# Patient Record
Sex: Male | Born: 1995 | Race: Black or African American | Hispanic: No | Marital: Single | Smoking: Former smoker
Health system: Southern US, Community
[De-identification: ages and names within clinical notes are randomized; demographics above are authoritative.]

---

## 2018-12-24 ENCOUNTER — Other Ambulatory Visit: Payer: Self-pay

## 2018-12-24 ENCOUNTER — Emergency Department: Payer: Self-pay

## 2018-12-24 ENCOUNTER — Emergency Department
Admission: EM | Admit: 2018-12-24 | Discharge: 2018-12-24 | Disposition: A | Discharge status: Home / Self Care | Payer: Self-pay | Attending: Emergency Medicine | Admitting: Emergency Medicine

## 2018-12-24 DIAGNOSIS — M5441 Lumbago with sciatica, right side: Secondary | ICD-10-CM | POA: Insufficient documentation

## 2018-12-24 MED ORDER — KETOROLAC TROMETHAMINE 30 MG/ML IJ SOLN
30.0000 mg | Freq: Once | INTRAMUSCULAR | Status: AC
  Administered 2018-12-24: 15:00:00 30 mg via INTRAMUSCULAR
  Filled 2018-12-24: qty 1

## 2018-12-24 MED ORDER — NAPROXEN 500 MG PO TABS: 500.0000 mg | ORAL_TABLET | Freq: Two times a day (BID) | ORAL | 0 refills | Status: AC

## 2018-12-24 NOTE — Discharge Instructions (Signed)
Follow-up with your primary care provider or Dr. Harlow Mares who is on-call for orthopedics if any continued problems with your back.  Take naproxen 500 mg twice daily with food.  You may use ice or heat to your back as needed for discomfort.

## 2018-12-24 NOTE — ED Provider Notes (Signed)
[facility] Emergency Department Provider Note   ____________________________________________   First MD Initiated Contact with Patient 12/24/18 1336     (approximate)  I have reviewed the triage vital signs and the nursing notes.   HISTORY  Chief Complaint Back Pain   HPI Eddie Mcdonald is a 23 y.o. male presents to the ED with complaint of low back pain shooting down his right leg.  Patient states that yesterday he was walking and stepped into a pothole.  He states that at that time he felt something "pop".  And since that time has experienced low back pain with radiation.  He has not taken any over-the-counter medication.  He denies any previous problems with his back.  He denies any urinary symptoms or incontinence of bowel or bladder.  Patient continues to ambulate without any assistance.  He rates his pain as a 7 out of 10.       History reviewed. No pertinent past medical history.  There are no active problems to display for this patient.   History reviewed. No pertinent surgical history.  Prior to Admission medications   Medication Sig Start Date End Date Taking? Authorizing Provider  naproxen (NAPROSYN) 500 MG tablet Take 1 tablet (500 mg total) by mouth 2 (two) times daily with a meal. 12/24/18   Tommi RumpsSummers, Issai Werling L, PA-C    Allergies Patient has no allergy information on record.  No family history on file.  Social History Social History   Tobacco Use   Smoking status: Not on file  Substance Use Topics   Alcohol use: Not on file   Drug use: Not on file    Review of Systems Constitutional: No fever/chills Cardiovascular: Denies chest pain. Respiratory: Denies shortness of breath. Gastrointestinal: No abdominal pain.  No nausea, no vomiting.  Genitourinary: Negative for dysuria. Musculoskeletal: Positive for low back pain.  Positive for right leg radiculopathy. Skin: Negative for rash. Neurological: Negative for headaches,  focal weakness or numbness. ___________________________________________   PHYSICAL EXAM:  VITAL SIGNS: ED Triage Vitals  Enc Vitals Group     BP 12/24/18 1319 119/81     Pulse Rate 12/24/18 1319 79     Resp 12/24/18 1319 18     Temp 12/24/18 1319 98.5 F (36.9 C)     Temp src --      SpO2 12/24/18 1319 99 %     Weight 12/24/18 1320 170 lb (77.1 kg)     Height 12/24/18 1320 6' (1.829 m)     Head Circumference --      Peak Flow --      Pain Score 12/24/18 1319 7     Pain Loc --      Pain Edu? --      Excl. in GC? --    Constitutional: Alert and oriented. Well appearing and in no acute distress. Eyes: Conjunctivae are normal.  Head: Atraumatic. Neck: No stridor.   Cardiovascular: Normal rate, regular rhythm. Grossly normal heart sounds.  Good peripheral circulation. Respiratory: Normal respiratory effort.  No retractions. Lungs CTAB. Musculoskeletal: On examination of the back there is no gross deformity and no step-offs noted on palpation of thoracic or lumbar spine.  There is tenderness on palpation of the right paravertebral muscles adjacent to L5-S1 area and the right SI joint region.  Good muscle strength bilaterally. Neurologic:  Normal speech and language. No gross focal neurologic deficits are appreciated. No gait instability. Skin:  Skin is warm, dry and intact. No rash  noted. Psychiatric: Mood and affect are normal. Speech and behavior are normal.  ____________________________________________   LABS (all labs ordered are listed, but only abnormal results are displayed)  Labs Reviewed - No data to display  RADIOLOGY   Official radiology report(s): Dg Lumbar Spine 2-3 Views  Result Date: 12/24/2018 CLINICAL DATA:  Low back pain radiating to the right lower extremity. EXAM: LUMBAR SPINE - 2-3 VIEW COMPARISON:  None. FINDINGS: There is no acute fracture or dislocation. The alignment is normal. There is probable minimal decreased intervertebral space at L4-5 and  L5-S1. IMPRESSION: Probable minimal decreased intervertebral space in the lower lumbar spine. The exam is otherwise normal. Electronically Signed   By: Abelardo Diesel M.D.   On: 12/24/2018 14:36    ____________________________________________   PROCEDURES  Procedure(s) performed (including Critical Care):  Procedures  ____________________________________________   INITIAL IMPRESSION / ASSESSMENT AND PLAN / ED COURSE  As part of my medical decision making, I reviewed the following data within the electronic MEDICAL RECORD NUMBER Notes from prior ED visits and [place] Controlled Substance Database  23 year old male presents to the ED with complaint of right lower back pain with radiation down his right leg that began yesterday after he stepped into a pothole.  He did not actually fall to the ground but heard a "pop".  He has continued to ambulate without any assistance.  Is not taking any over-the-counter medication for his pain.  He denies any incontinence of bowel or bladder or saddle anesthesias.  X-rays did not show any bony abnormality.  There was some minimal narrowing between L4-L5, L5-S1 intervertebral spaces.  Patient was given Toradol IM while in the ED.  He was discharged with prescription for naproxen 500 mg twice daily with food.  He is to follow-up with Dr. Harlow Mares who is the orthopedist on call if any continued problems with his back.  ____________________________________________   FINAL CLINICAL IMPRESSION(S) / ED DIAGNOSES  Final diagnoses:  Acute right-sided low back pain with right-sided sciatica     ED Discharge Orders         Ordered    naproxen (NAPROSYN) 500 MG tablet  2 times daily with meals     12/24/18 1458           Note:  This document was prepared using Dragon voice recognition software and may include unintentional dictation errors.    Johnn Hai, PA-C 12/24/18 1511    Delman Kitten, MD 12/26/18 1020

## 2018-12-24 NOTE — ED Triage Notes (Signed)
Pt comes via pOV from home with c/o lower back pain that shoots down right leg. Pt states yesterday he was walking and stepped into a pot hole. Pt states he thinks he heard something pop. Pt states he can walk but has pain.

## 2018-12-24 NOTE — ED Notes (Signed)
Pt verbalized understanding of discharge instructions. NAD at this time. 

## 2019-05-22 ENCOUNTER — Ambulatory Visit: Payer: Self-pay | Admitting: Physician Assistant

## 2019-05-22 ENCOUNTER — Encounter: Payer: Self-pay | Admitting: Physician Assistant

## 2019-05-22 ENCOUNTER — Other Ambulatory Visit: Payer: Self-pay

## 2019-05-22 DIAGNOSIS — Z113 Encounter for screening for infections with a predominantly sexual mode of transmission: Secondary | ICD-10-CM

## 2019-05-22 LAB — GRAM STAIN

## 2019-05-22 NOTE — Progress Notes (Signed)
   [facility] Department STI clinic/screening visit  Subjective:  Eddie Catching Richerd Grime. is a 24 y.o. male being seen today for an STI screening visit. The patient reports they do not have symptoms.    Patient has the following medical conditions:  There are no problems to display for this patient.    Chief Complaint  Patient presents with  . SEXUALLY TRANSMITTED DISEASE    HPI  Patient reports that he does not have any symptoms, but would like a screening today.  Denies any concerns today.   See flowsheet for further details and programmatic requirements.    The following portions of the patient's history were reviewed and updated as appropriate: allergies, current medications, past medical history, past social history, past surgical history and problem list.  Objective:  There were no vitals filed for this visit.  Physical Exam Constitutional:      General: He is not in acute distress.    Appearance: Normal appearance. He is normal weight.  HENT:     Head: Normocephalic and atraumatic.     Comments: No nits, lice, or hair loss. No cervical, supraclavicular or axillary adenopathy.    Mouth/Throat:     Mouth: Mucous membranes are moist.     Pharynx: Oropharynx is clear. No oropharyngeal exudate or posterior oropharyngeal erythema.  Eyes:     Conjunctiva/sclera: Conjunctivae normal.  Pulmonary:     Effort: Pulmonary effort is normal.  Abdominal:     Palpations: Abdomen is soft. There is no mass.     Tenderness: There is no abdominal tenderness. There is no guarding or rebound.  Genitourinary:    Penis: Normal.      Testes: Normal.     Comments: Pubic area without nits, lice, edema, erythema, lesions and inguinal adenopathy,. Penis circumcised, without lesions and discharge from meatus. Musculoskeletal:     Cervical back: Neck supple. No tenderness.  Skin:    General: Skin is dry.     Findings: No bruising, erythema, lesion or rash.  Neurological:   Mental Status: He is alert and oriented to person, place, and time.  Psychiatric:        Mood and Affect: Mood normal.        Behavior: Behavior normal.        Thought Content: Thought content normal.        Judgment: Judgment normal.       Assessment and Plan:  Eddie Mcdonald. is a 24 y.o. male presenting to the [facility] Department for STI screening  1. Screening for STD (sexually transmitted disease) Patient into clinic without symptoms. Rec condoms with all sex. Await test results.  Counseled that RN will call if needs to RTC for treatment once results are back. - Gram stain - Gonococcus culture - HIV/HCV [place] [facility] - Syphilis Serology, [place] [facility] - Gonococcus culture     No follow-ups on file.  No future appointments.  Eddie Holmes, PA

## 2019-05-27 LAB — GONOCOCCUS CULTURE

## 2019-06-02 LAB — HM HIV SCREENING LAB: HM HIV Screening: NEGATIVE

## 2019-06-02 LAB — HM HEPATITIS C SCREENING LAB: HM Hepatitis Screen: NEGATIVE

## 2019-08-24 ENCOUNTER — Other Ambulatory Visit: Payer: Self-pay

## 2019-08-24 ENCOUNTER — Ambulatory Visit (LOCAL_COMMUNITY_HEALTH_CENTER): Payer: Self-pay

## 2019-08-24 DIAGNOSIS — Z111 Encounter for screening for respiratory tuberculosis: Secondary | ICD-10-CM

## 2019-08-27 ENCOUNTER — Ambulatory Visit (LOCAL_COMMUNITY_HEALTH_CENTER): Payer: Self-pay

## 2019-08-27 ENCOUNTER — Other Ambulatory Visit: Payer: Self-pay

## 2019-08-27 DIAGNOSIS — Z111 Encounter for screening for respiratory tuberculosis: Secondary | ICD-10-CM

## 2019-08-27 LAB — TB SKIN TEST
Induration: 0 mm
TB Skin Test: NEGATIVE

## 2020-11-03 ENCOUNTER — Other Ambulatory Visit: Payer: Self-pay

## 2020-11-03 DIAGNOSIS — K0889 Other specified disorders of teeth and supporting structures: Secondary | ICD-10-CM | POA: Insufficient documentation

## 2020-11-03 DIAGNOSIS — K047 Periapical abscess without sinus: Secondary | ICD-10-CM | POA: Insufficient documentation

## 2020-11-03 DIAGNOSIS — Z87891 Personal history of nicotine dependence: Secondary | ICD-10-CM | POA: Insufficient documentation

## 2020-11-03 NOTE — ED Triage Notes (Addendum)
Pt co toothache states has had same in the past but has not seen dentist. Swelling noted to left side of face.

## 2020-11-04 ENCOUNTER — Emergency Department: Payer: Self-pay

## 2020-11-04 ENCOUNTER — Emergency Department
Admission: EM | Admit: 2020-11-04 | Discharge: 2020-11-04 | Disposition: A | Discharge status: Home / Self Care | Payer: Self-pay | Attending: Emergency Medicine | Admitting: Emergency Medicine

## 2020-11-04 DIAGNOSIS — K047 Periapical abscess without sinus: Secondary | ICD-10-CM

## 2020-11-04 DIAGNOSIS — K029 Dental caries, unspecified: Secondary | ICD-10-CM

## 2020-11-04 MED ORDER — OXYCODONE-ACETAMINOPHEN 5-325 MG PO TABS
2.0000 | ORAL_TABLET | Freq: Once | ORAL | Status: AC
  Administered 2020-11-04: 2 via ORAL
  Filled 2020-11-04: qty 2

## 2020-11-04 MED ORDER — MAGIC MOUTHWASH W/LIDOCAINE: 5.0000 mL | Freq: Four times a day (QID) | ORAL | 0 refills | Status: AC | PRN

## 2020-11-04 MED ORDER — CHLORHEXIDINE GLUCONATE 0.12 % MT SOLN: 15.0000 mL | Freq: Two times a day (BID) | OROMUCOSAL | 0 refills | Status: AC

## 2020-11-04 MED ORDER — LIDOCAINE VISCOUS HCL 2 % MT SOLN
15.0000 mL | Freq: Once | OROMUCOSAL | Status: AC
  Administered 2020-11-04: 15 mL via OROMUCOSAL
  Filled 2020-11-04: qty 15

## 2020-11-04 MED ORDER — OXYCODONE-ACETAMINOPHEN 5-325 MG PO TABS: 2.0000 | ORAL_TABLET | Freq: Four times a day (QID) | ORAL | 0 refills | Status: AC | PRN

## 2020-11-04 MED ORDER — AMOXICILLIN-POT CLAVULANATE 875-125 MG PO TABS
1.0000 | ORAL_TABLET | Freq: Once | ORAL | Status: AC
  Administered 2020-11-04: 1 via ORAL
  Filled 2020-11-04: qty 1

## 2020-11-04 NOTE — ED Provider Notes (Signed)
[facility] Emergency Department Provider Note  ____________________________________________   Event Date/Time   First MD Initiated Contact with Patient 11/04/20 0159     (approximate)  I have reviewed the triage vital signs and the nursing notes.   HISTORY  Chief Complaint Dental Pain    HPI Eddie Mcdonald. is a 25 y.o. male with a history of chronic dental issues including the need about 15 months ago for emergency dental surgery at [facility] due to an infection apparently due to an impacted wisdom tooth.  He presents tonight for cute onset of pain first on the left side of his upper jaw but then quickly spreading to both sides of both upper and lower jaws.  He said that it hurts when he eats or drinks anything and opens his mouth too wide.  He does not have any trouble swallowing and no pain in his throat and no issues with breathing.  Nothing particular makes his symptoms better.  He has not been to see a dentist recently.  He denies recent fever.  He said the pain makes it difficult for him to do very much of anything.     No past medical history on file.  There are no problems to display for this patient.   No past surgical history on file.  Prior to Admission medications   Medication Sig Start Date End Date Taking? Authorizing Provider  chlorhexidine (PERIDEX) 0.12 % solution 15 mLs by Mouth Rinse route 2 (two) times daily. 11/04/20  Yes Loleta Rose, MD  magic mouthwash w/lidocaine SOLN Take 5 mLs by mouth 4 (four) times daily as needed for mouth pain. Swish and spit, do not swallow the solution. 11/04/20  Yes Loleta Rose, MD  oxyCODONE-acetaminophen (PERCOCET) 5-325 MG tablet Take 2 tablets by mouth every 6 (six) hours as needed for severe pain. 11/04/20  Yes Loleta Rose, MD  naproxen (NAPROSYN) 500 MG tablet Take 1 tablet (500 mg total) by mouth 2 (two) times daily with a meal. 12/24/18   Tommi Rumps, PA-C    Allergies Bee venom  No  family history on file.  Social History Social History   Tobacco Use   Smoking status: Former    Pack years: 0.00   Smokeless tobacco: Never  Substance Use Topics   Alcohol use: Yes    Comment: occasionally   Drug use: Not Currently    Types: Marijuana    Review of Systems Constitutional: No fever/chills Eyes: No visual changes. ENT: Dental pain as described above.  No difficulty swallowing. Respiratory: Denies shortness of breath. Gastrointestinal: No abdominal pain.  No nausea, no vomiting.   Integumentary: Negative for rash. Neurological: Negative for headaches, focal weakness or numbness.   ____________________________________________   PHYSICAL EXAM:  VITAL SIGNS: ED Triage Vitals  Enc Vitals Group     BP 11/03/20 2306 138/84     Pulse Rate 11/03/20 2304 88     Resp 11/03/20 2304 18     Temp 11/03/20 2304 97.9 F (36.6 C)     Temp Source 11/03/20 2304 Oral     SpO2 11/03/20 2304 98 %     Weight 11/03/20 2306 83.9 kg (185 lb)     Height 11/03/20 2306 1.702 m (5\' 7" )     Head Circumference --      Peak Flow --      Pain Score 11/03/20 2306 9     Pain Loc --      Pain Edu? --  Excl. in GC? --     Constitutional: Alert and oriented.  Eyes: Conjunctivae are normal.  Head: Atraumatic. Nose: No congestion/rhinnorhea. Mouth/Throat: No trismus.  No obvious swelling of the oropharynx or around the tonsillar pillars.  There is no obvious dental infection or swelling although the patient reports pain everywhere I touch.  There are no loose teeth no evidence of recent injury.  No airway compromise. Neck: No stridor.  No meningeal signs.  No palpable lymphadenopathy, no brawny induration beneath the mandible. Cardiovascular: Normal rate, regular rhythm. Good peripheral circulation. Respiratory: Normal respiratory effort.  No retractions. Gastrointestinal: Soft and nontender. No distention.  Musculoskeletal: No lower extremity tenderness nor edema. No gross  deformities of extremities. Neurologic:  Normal speech and language. No gross focal neurologic deficits are appreciated.  Skin:  Skin is warm, dry and intact.   ____________________________________________    RADIOLOGY I, Loleta Rose, personally viewed and evaluated these images (plain radiographs) as part of my medical decision making, as well as reviewing the written report by the radiologist.  ED MD interpretation:.  Dental disease with dental caries of multiple teeth and a periapical abscess involving the root of tooth #8.  No soft tissue abscess.  Official radiology report(s): CT Maxillofacial Wo Contrast  Result Date: 11/04/2020 CLINICAL DATA:  Maxillofacial pain, left facial swelling EXAM: CT MAXILLOFACIAL WITHOUT CONTRAST TECHNIQUE: Multidetector CT imaging of the maxillofacial structures was performed. Multiplanar CT image reconstructions were also generated. COMPARISON:  None. FINDINGS: Osseous: No facial fracture. No mandibular dislocation. Teeth 16 and 17 are absent. Teeth 1 and 32 appear impacted with erosions in keeping with dental caries. Dental caries identified within tooth 15. There is a periapical abscess with erosion of the buccal cortex of the maxilla involving the base of tooth 8. Orbits: Negative. No traumatic or inflammatory finding. Sinuses: Clear. Soft tissues: The facial soft tissues are unremarkable. The sublingual, submandibular, and parotid salivary glands are unremarkable. No subcutaneous fluid collections are identified. No significant soft tissue swelling is identified. Limited intracranial: No significant or unexpected finding. 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 Periapical abscess involving root of tooth 8 with erosion of the buccal cortex of the maxilla. No associated soft tissue abscess. Electronically Signed   By: Helyn Numbers MD   On: 11/04/2020 03:47     ____________________________________________   PROCEDURES   Procedure(s) performed (including Critical Care):  Procedures   ____________________________________________   INITIAL IMPRESSION / MDM / ASSESSMENT AND PLAN / ED COURSE  As part of my medical decision making, I reviewed the following data within the electronic MEDICAL RECORD NUMBER Nursing notes reviewed and incorporated, Old chart reviewed, and Notes from prior ED visits and reviewed the [place] controlled substance database.  Patient has an unusual presentation with complaints of initially pain on the left side of his mouth and then pain in bilateral upper and lower parts of his mouth but he does have a history of extensive dental issues even that required surgery.  His vital signs are stable and within normal limits and he is afebrile.  No airway compromise, no evidence of Ludwick's angina or other emergent airway issue or potential need for oral maxillofacial surgery.  I obtained a CT maxillofacial without contrast to look for evidence of soft tissue abscess or other indication that we would need to proceed with a scan with IV contrast.  The patient has multiple dental caries and a periapical abscess of tooth #8 but no drainable fluid  collections and no indication that he needs additional imaging or emergent surgical treatment.  I am starting the patient on Augmentin and will give him Peridex mouthwash prescription and a short course of Percocet and strongly encouraged him to follow-up with a dentist at the next available opportunity.  He was provided with the dental resource guide.  He says that he understands the plan.           ____________________________________________  FINAL CLINICAL IMPRESSION(S) / ED DIAGNOSES  Final diagnoses:  Pain due to dental caries  Dental infection     MEDICATIONS GIVEN DURING THIS VISIT:  Medications  amoxicillin-clavulanate (AUGMENTIN) 875-125 MG per tablet 1 tablet (1  tablet Oral Given 11/04/20 0438)  lidocaine (XYLOCAINE) 2 % viscous mouth solution 15 mL (15 mLs Mouth/Throat Given 11/04/20 0438)  oxyCODONE-acetaminophen (PERCOCET/ROXICET) 5-325 MG per tablet 2 tablet (2 tablets Oral Given 11/04/20 0438)     ED Discharge Orders          Ordered    oxyCODONE-acetaminophen (PERCOCET) 5-325 MG tablet  Every 6 hours PRN        11/04/20 0437    magic mouthwash w/lidocaine SOLN  4 times daily PRN       Note to Pharmacy: Please mix viscous lidocaine 2% with magic mouthwash solution so that the lidocaine comprises approximately 25 % of the total solution.   11/04/20 0437    chlorhexidine (PERIDEX) 0.12 % solution  2 times daily        11/04/20 4037             Note:  This document was prepared using Dragon voice recognition software and may include unintentional dictation errors.   Loleta Rose, MD 11/04/20 [id number]

## 2020-11-04 NOTE — Discharge Instructions (Addendum)

## 2021-02-08 ENCOUNTER — Ambulatory Visit: Payer: Self-pay

## 2021-02-10 ENCOUNTER — Ambulatory Visit: Payer: Self-pay | Admitting: Physician Assistant

## 2021-02-10 ENCOUNTER — Other Ambulatory Visit: Payer: Self-pay

## 2021-02-10 ENCOUNTER — Encounter: Payer: Self-pay | Admitting: Physician Assistant

## 2021-02-10 DIAGNOSIS — Z113 Encounter for screening for infections with a predominantly sexual mode of transmission: Secondary | ICD-10-CM

## 2021-02-10 DIAGNOSIS — Z202 Contact with and (suspected) exposure to infections with a predominantly sexual mode of transmission: Secondary | ICD-10-CM

## 2021-02-10 MED ORDER — DOXYCYCLINE HYCLATE 100 MG PO TABS: 100.0000 mg | ORAL_TABLET | Freq: Two times a day (BID) | ORAL | 0 refills | Status: AC

## 2021-02-10 NOTE — Progress Notes (Signed)
   [facility] Department STI clinic/screening visit  Subjective:  Eddie Zane. is a 25 y.o. male being seen today for an STI screening visit. The patient reports they do not have symptoms.    Patient has the following medical conditions:  There are no problems to display for this patient.    Chief Complaint  Patient presents with   SEXUALLY TRANSMITTED DISEASE    screening    HPI  Patient reports that he is not having any symptoms but is a contact to Chlamydia.  Denies chronic conditions and regular medicines.  Reports last HIV test was last year and last void prior to sample collection for Gram stain was over 2 hr ago.    Screening for MPX risk: Does the patient have an unexplained rash? No Is the patient MSM? No Does the patient endorse multiple sex partners or anonymous sex partners? No Did the patient have close or sexual contact with a person diagnosed with MPX? No Has the patient traveled outside the [place] where MPX is endemic? No Is there a high clinical suspicion for MPX-- evidenced by one of the following No  -Unlikely to be chickenpox  -Lymphadenopathy  -Rash that present in same phase of evolution on any given body part   See flowsheet for further details and programmatic requirements.    The following portions of the patient's history were reviewed and updated as appropriate: allergies, current medications, past medical history, past social history, past surgical history and problem list.  Objective:  There were no vitals filed for this visit.  Physical Exam Constitutional:      General: He is not in acute distress.    Appearance: Normal appearance.  HENT:     Head: Normocephalic and atraumatic.  Eyes:     Conjunctiva/sclera: Conjunctivae normal.  Pulmonary:     Effort: Pulmonary effort is normal.  Skin:    General: Skin is warm and dry.  Neurological:     Mental Status: He is alert and oriented to person, place, and time.   Psychiatric:        Mood and Affect: Mood normal.        Behavior: Behavior normal.        Thought Content: Thought content normal.        Judgment: Judgment normal.      Assessment and Plan:  Eddie Catching Dewel Lotter. is a 25 y.o. male presenting to the [facility] Department for STI screening  1. Screening for STD (sexually transmitted disease) Patient into clinic without symptoms. Patient declines screening exam and blood work today.  Requests treatment as a contact only.  Rec condoms with all sex.    2. Chlamydia contact Treat as a contact to Chlamydia with Doxycycline 100 mg #14 1 po BID for 7 days. No sex for 14 days and until after partner completes treatment. Call with questions or concerns.  - doxycycline (VIBRA-TABS) 100 MG tablet; Take 1 tablet (100 mg total) by mouth 2 (two) times daily for 7 days.  Dispense: 14 tablet; Refill: 0     No follow-ups on file.  No future appointments.  Matt Holmes, PA

## 2021-02-19 NOTE — Progress Notes (Signed)
Chart reviewed by Pharmacist  Suzanne Walker PharmD, Contract Pharmacist at [facility]  

## 2022-12-22 IMAGING — CT CT MAXILLOFACIAL W/O CM
3 of 6 series · 15 of 47 positions shown, 18 images · non-contrast
Comparison: None.

CLINICAL DATA: Maxillofacial pain, left facial swelling

EXAM:
CT MAXILLOFACIAL WITHOUT CONTRAST
TECHNIQUE: Multidetector CT imaging of the maxillofacial structures was
performed. Multiplanar CT image reconstructions were also generated.

[Series 2: max soft · axial · 0.34mm/px · z∈[-164,-20]mm · 9 of 86 slices shown, 12 images]
[im 9/86  brain]
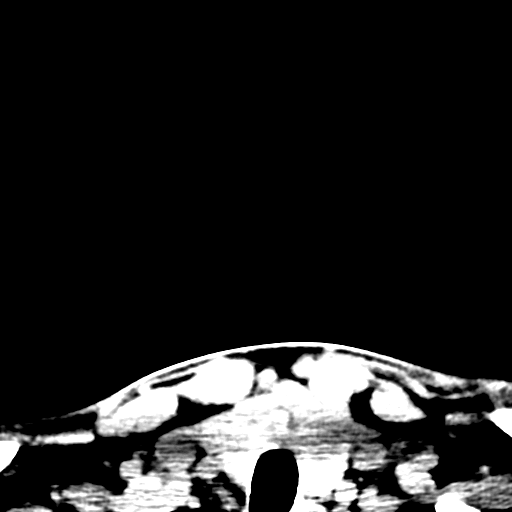
[im 9/86  bone]
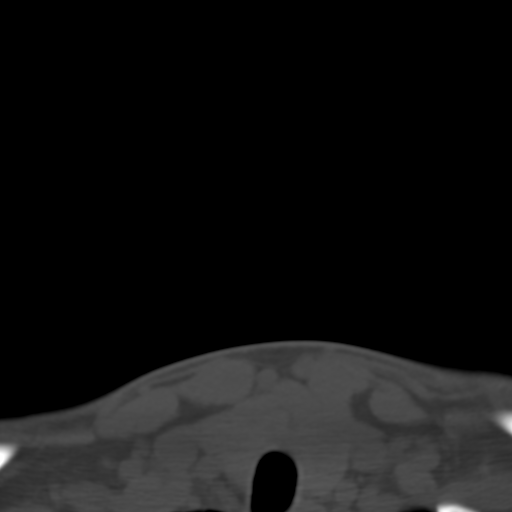
[im 18/86  bone]
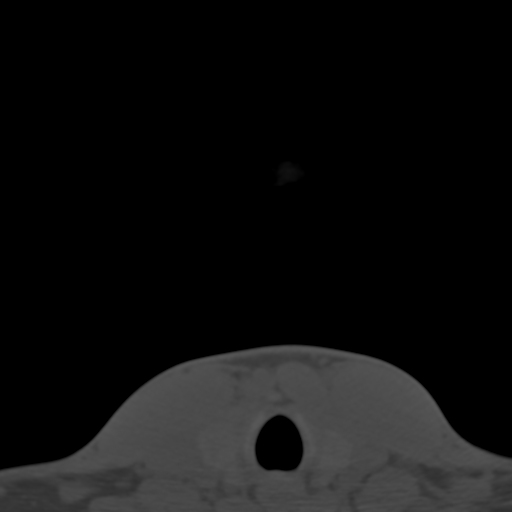
[im 27/86  bone]
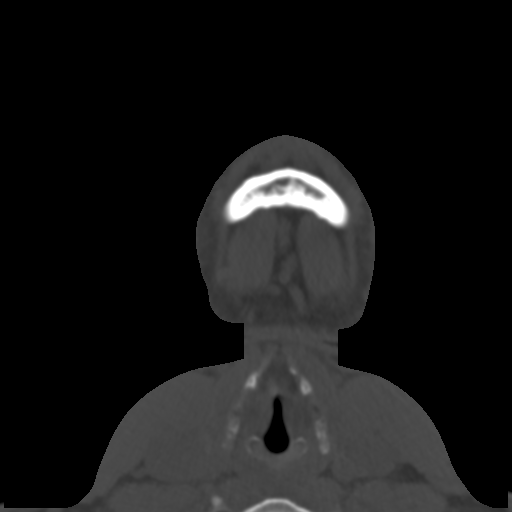
[im 36/86  bone]
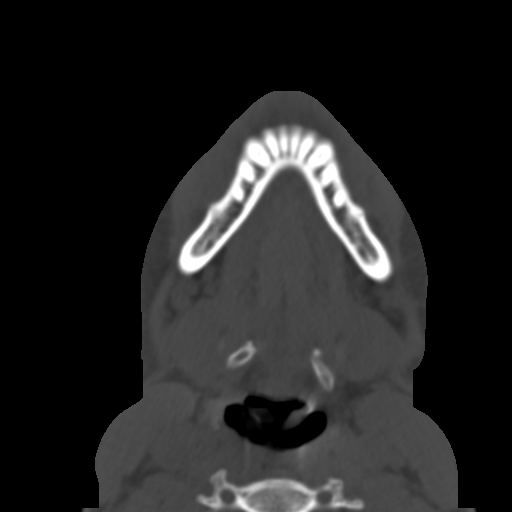
[im 45/86  brain]
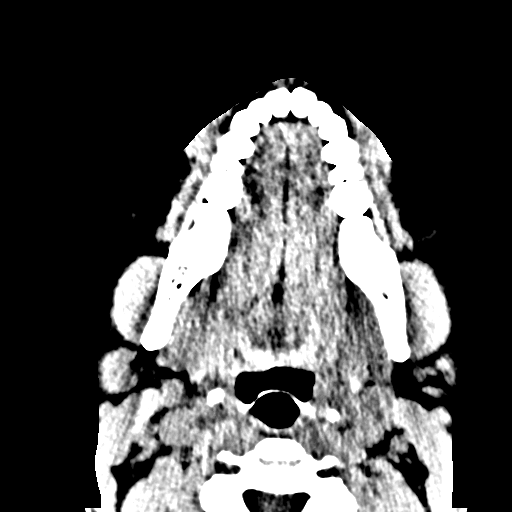
[im 45/86  bone]
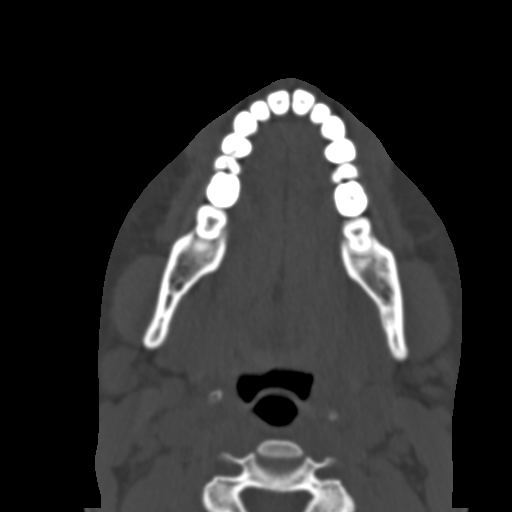
[im 54/86  bone]
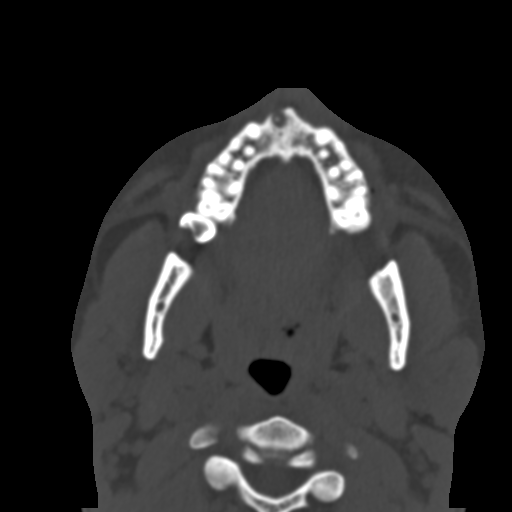
[im 63/86  bone]
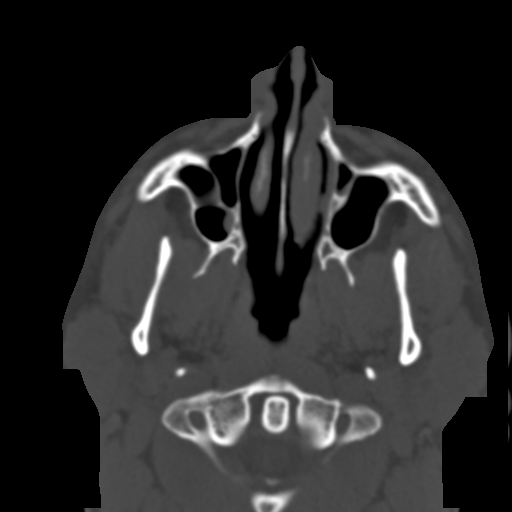
[im 72/86  bone]
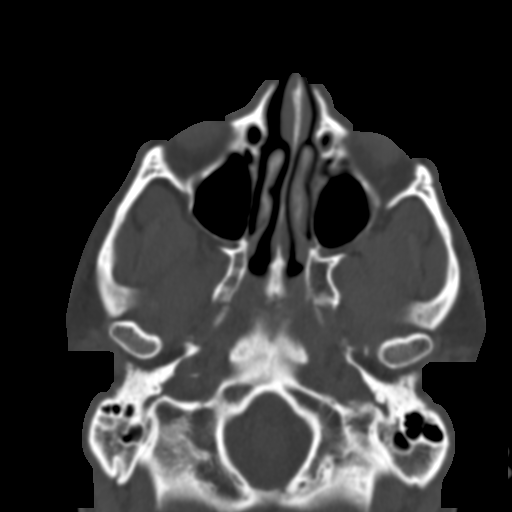
[im 81/86  brain]
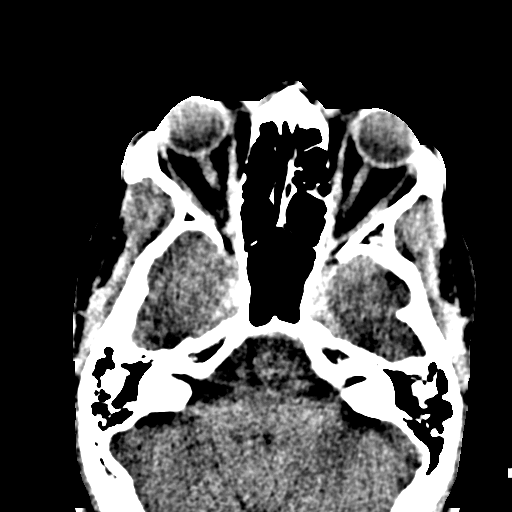
[im 81/86  bone]
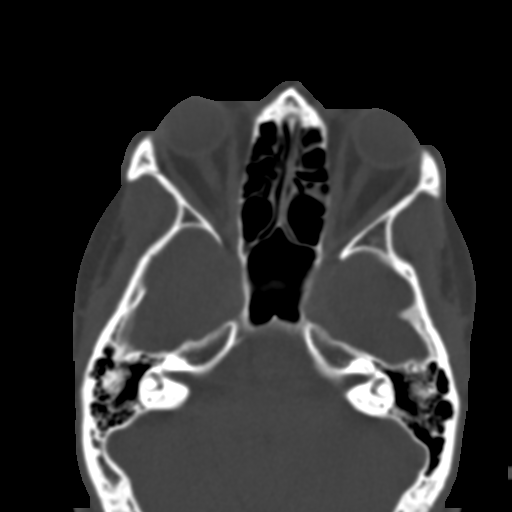

[Series 6: coronal soft · coronal · 0.31mm/px · 3 of 94 slices shown]
[im 24/94  bone]
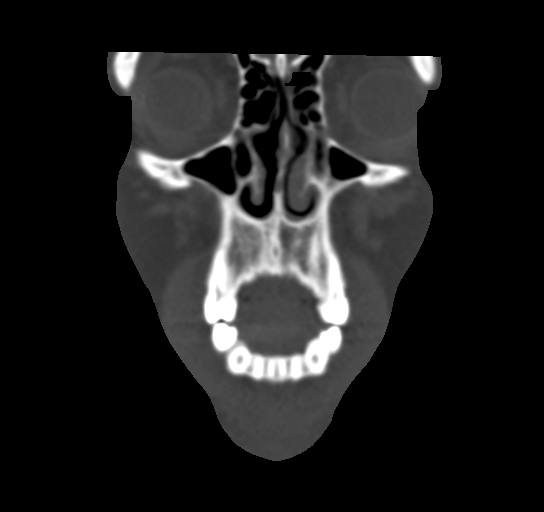
[im 47/94  bone]
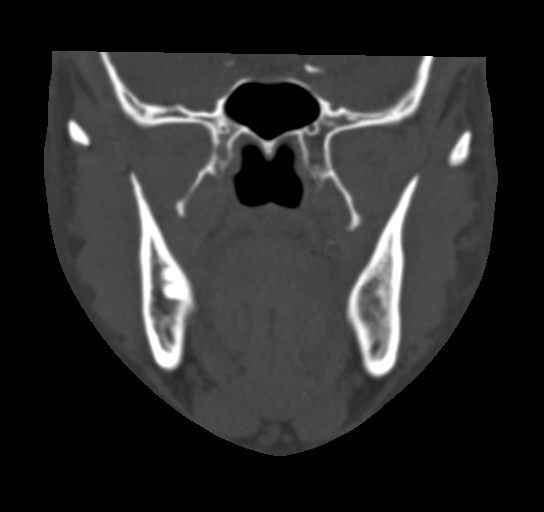
[im 70/94  bone]
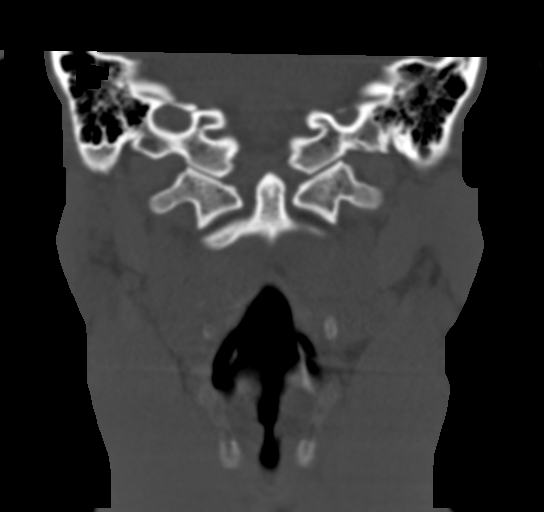

[Series 7: sagittal soft · sagittal · 0.32mm/px · 3 of 97 slices shown]
[im 47/97  bone]
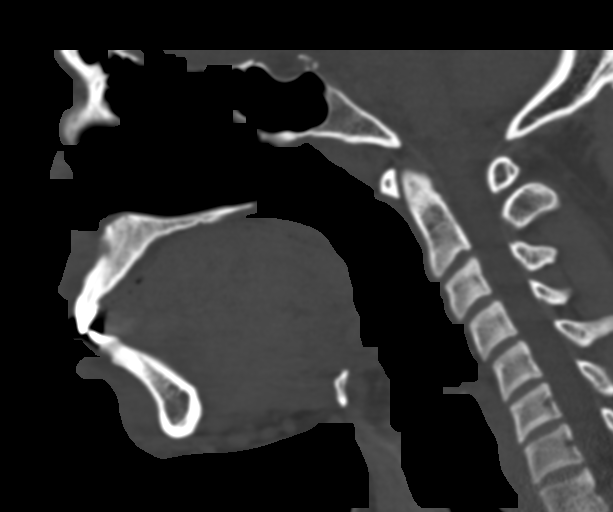
[im 63/97  bone]
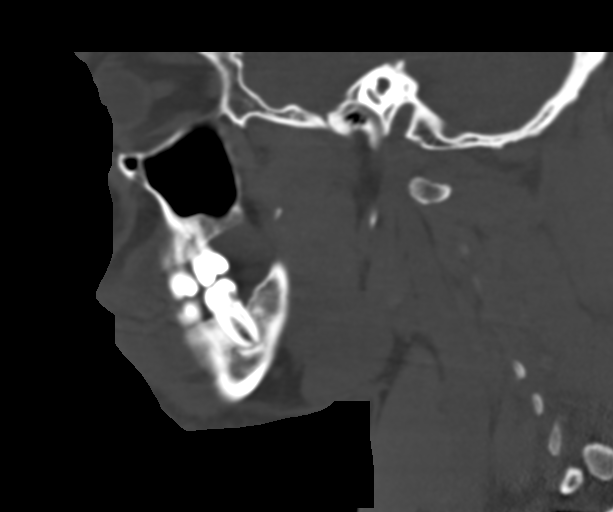
[im 80/97  bone]
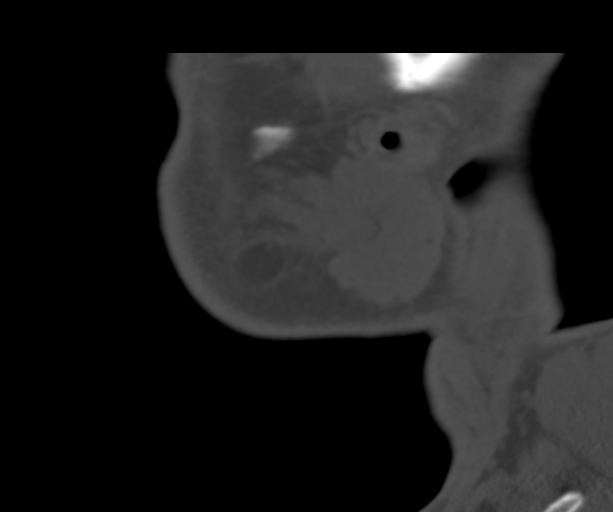

[15 of 47 positions shown; findings below may reference images not displayed]

FINDINGS: Osseous: No facial fracture. No mandibular dislocation. Teeth 16 and
17 are absent. Teeth 1 and 32 appear impacted with erosions in
keeping with dental caries. Dental caries identified within tooth
15. There is a periapical abscess with erosion of the buccal cortex
of the maxilla involving the base of tooth 8.

Orbits: Negative. No traumatic or inflammatory finding.

Sinuses: Clear.

Soft tissues: The facial soft tissues are unremarkable. The
sublingual, submandibular, and parotid salivary glands are
unremarkable. No subcutaneous fluid collections are identified. No
significant soft tissue swelling is identified.

Limited intracranial: No significant or unexpected finding.
IMPRESSION: Periodontal disease with dental caries within teeth 1, 15, and 32.
Periapical abscess involving root of tooth 8 with erosion of the
buccal cortex of the maxilla. No associated soft tissue abscess.
